# Patient Record
Sex: Female | Born: 1995 | Race: White | Hispanic: No | Marital: Single | Smoking: Never smoker
Health system: Southern US, Community
[De-identification: ages and names within clinical notes are randomized; demographics above are authoritative.]

## PROBLEM LIST (undated history)

## (undated) DIAGNOSIS — J189 Pneumonia, unspecified organism: Secondary | ICD-10-CM

---
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
## 2015-01-22 ENCOUNTER — Encounter (HOSPITAL_COMMUNITY): Payer: Self-pay | Admitting: Emergency Medicine

## 2015-01-22 ENCOUNTER — Emergency Department (HOSPITAL_COMMUNITY): Payer: Managed Care, Other (non HMO)

## 2015-01-22 ENCOUNTER — Emergency Department (HOSPITAL_COMMUNITY)
Admission: EM | Admit: 2015-01-22 | Discharge: 2015-01-22 | Disposition: A | Discharge status: Home / Self Care | Payer: Managed Care, Other (non HMO) | Attending: Emergency Medicine | Admitting: Emergency Medicine

## 2015-01-22 DIAGNOSIS — R55 Syncope and collapse: Secondary | ICD-10-CM

## 2015-01-22 DIAGNOSIS — J189 Pneumonia, unspecified organism: Secondary | ICD-10-CM | POA: Insufficient documentation

## 2015-01-22 DIAGNOSIS — Z792 Long term (current) use of antibiotics: Secondary | ICD-10-CM | POA: Diagnosis not present

## 2015-01-22 DIAGNOSIS — R0602 Shortness of breath: Secondary | ICD-10-CM | POA: Diagnosis not present

## 2015-01-22 DIAGNOSIS — Z3202 Encounter for pregnancy test, result negative: Secondary | ICD-10-CM | POA: Diagnosis not present

## 2015-01-22 HISTORY — DX: Pneumonia, unspecified organism: J18.9

## 2015-01-22 LAB — BASIC METABOLIC PANEL
Anion gap: 8 (ref 5–15)
BUN: 12 mg/dL (ref 6–20)
CHLORIDE: 101 mmol/L (ref 101–111)
CO2: 27 mmol/L (ref 22–32)
CREATININE: 0.62 mg/dL (ref 0.44–1.00)
Calcium: 9.3 mg/dL (ref 8.9–10.3)
GFR calc Af Amer: >60 mL/min (ref >60)
GFR calc non Af Amer: >60 mL/min (ref >60)
GLUCOSE: 88 mg/dL (ref 65–99)
POTASSIUM: 3.9 mmol/L (ref 3.5–5.1)
SODIUM: 136 mmol/L (ref 135–145)

## 2015-01-22 LAB — D-DIMER, QUANTITATIVE: D-Dimer, Quant: 1.06 ug/mL-FEU — ABNORMAL HIGH (ref 0.00–0.48)

## 2015-01-22 LAB — CBC
HEMATOCRIT: 38.8 % (ref 36.0–46.0)
HEMOGLOBIN: 13.1 g/dL (ref 12.0–15.0)
MCH: 29.5 pg (ref 26.0–34.0)
MCHC: 33.8 g/dL (ref 30.0–36.0)
MCV: 87.4 fL (ref 78.0–100.0)
Platelets: 485 10*3/uL — ABNORMAL HIGH (ref 150–400)
RBC: 4.44 MIL/uL (ref 3.87–5.11)
RDW: 12.4 % (ref 11.5–15.5)
WBC: 8.2 10*3/uL (ref 4.0–10.5)

## 2015-01-22 LAB — PREGNANCY, URINE: PREG TEST UR: NEGATIVE

## 2015-01-22 MED ORDER — IOHEXOL 350 MG/ML SOLN
100.0000 mL | Freq: Once | INTRAVENOUS | Status: AC | PRN
  Administered 2015-01-22: 100 mL via INTRAVENOUS

## 2015-01-22 NOTE — Discharge Instructions (Signed)
Continue the anti-biotics you are currently taking for pneumonia. Continue hydrating with fluids at home, drink plenty of water and Gatorade. Return to the ER if any severe chest pain, shortness of breath, lightheadedness or dizziness, high fever greater than 100.47F, worsening of symptoms.   Syncope Syncope is a medical term for fainting or passing out. This means you lose consciousness and drop to the ground. People are generally unconscious for less than 5 minutes. You may have some muscle twitches for up to 15 seconds before waking up and returning to normal. Syncope occurs more often in older adults, but it can happen to anyone. While most causes of syncope are not dangerous, syncope can be a sign of a serious medical problem. It is important to seek medical care.  CAUSES  Syncope is caused by a sudden drop in blood flow to the brain. The specific cause is often not determined. Factors that can bring on syncope include:  Taking medicines that lower blood pressure.  Sudden changes in posture, such as standing up quickly.  Taking more medicine than prescribed.  Standing in one place for too long.  Seizure disorders.  Dehydration and excessive exposure to heat.  Low blood sugar (hypoglycemia).  Straining to have a bowel movement.  Heart disease, irregular heartbeat, or other circulatory problems.  Fear, emotional distress, seeing blood, or severe pain. SYMPTOMS  Right before fainting, you may:  Feel dizzy or light-headed.  Feel nauseous.  See all white or all black in your field of vision.  Have cold, clammy skin. DIAGNOSIS  Your health care provider will ask about your symptoms, perform a physical exam, and perform an electrocardiogram (ECG) to record the electrical activity of your heart. Your health care provider may also perform other heart or blood tests to determine the cause of your syncope which may include:  Transthoracic echocardiogram (TTE). During  echocardiography, sound waves are used to evaluate how blood flows through your heart.  Transesophageal echocardiogram (TEE).  Cardiac monitoring. This allows your health care provider to monitor your heart rate and rhythm in real time.  Holter monitor. This is a portable device that records your heartbeat and can help diagnose heart arrhythmias. It allows your health care provider to track your heart activity for several days, if needed.  Stress tests by exercise or by giving medicine that makes the heart beat faster. TREATMENT  In most cases, no treatment is needed. Depending on the cause of your syncope, your health care provider may recommend changing or stopping some of your medicines. HOME CARE INSTRUCTIONS  Have someone stay with you until you feel stable.  Do not drive, use machinery, or play sports until your health care provider says it is okay.  Keep all follow-up appointments as directed by your health care provider.  Lie down right away if you start feeling like you might faint. Breathe deeply and steadily. Wait until all the symptoms have passed.  Drink enough fluids to keep your urine clear or pale yellow.  If you are taking blood pressure or heart medicine, get up slowly and take several minutes to sit and then stand. This can reduce dizziness. SEEK IMMEDIATE MEDICAL CARE IF:   You have a severe headache.  You have unusual pain in the chest, abdomen, or back.  You are bleeding from your mouth or rectum, or you have black or tarry stool.  You have an irregular or very fast heartbeat.  You have pain with breathing.  You have repeated fainting or  seizure-like jerking during an episode.  You faint when sitting or lying down.  You have confusion.  You have trouble walking.  You have severe weakness.  You have vision problems. If you fainted, call your local emergency services (911 in [place] Do not drive yourself to the hospital.  MAKE SURE YOU:  Understand  these instructions.  Will watch your condition.  Will get help right away if you are not doing well or get worse. Document Released: 07/22/2005 Document Revised: 07/27/2013 Document Reviewed: 09/20/2011 [facility] Patient Information 2015 [place], [place]. This information is not intended to replace advice given to you by your health care provider. Make sure you discuss any questions you have with your health care provider.

## 2015-01-22 NOTE — ED Provider Notes (Signed)
CSN: [id number]     Arrival date & time 01/22/15  1442 History   First MD Initiated Contact with Patient 01/22/15 1538     Chief Complaint  Patient presents with  . Loss of Consciousness     (Consider location/radiation/quality/duration/timing/severity/associated sxs/prior Treatment) HPI Jody Cook is a 19 year old female with no past medical history who presents the ER complaining of syncopal episode. Patient states that over the last week she was hospitalized for pneumonia popping out of town in [place]. Patient states she was just discharged this past Wednesday, 5 days ago. She states she is currently on Keflex by mouth for her pneumonia. Patient reports improvement of her symptoms of pneumonia. Patient states over the last week she's had poor PO intake , and has had no appetite. She states she has been drinking fluids, however has not eaten much. Patient states she has not eaten anything since last night. Patient states today she was waiting on line at [employer], and began feeling "lightheaded". Patient states she tried to hold on, however suffered a syncopal episode. Patient's family in the room with her state they missed this episode, and were able to catch her and sit her down. The report a complete loss of consciousness with rapid regaining of consciousness afterwards. They report mild fasciculations, however patient states she is able to recall all the events before and after this episode. There is no urinary or bowel incontinence. Patient denies any associated chest pain, shortness of breath, nausea, vomiting, abdominal pain, dysuria.  Past Medical History  Diagnosis Date  . Pneumonia    History reviewed. No pertinent past surgical history. History reviewed. No pertinent family history. History  Substance Use Topics  . Smoking status: Never Smoker   . Smokeless tobacco: Not on file  . Alcohol Use: No   OB History    No data available     Review of Systems  Constitutional:  Negative for fever.  HENT: Negative for trouble swallowing.   Eyes: Negative for visual disturbance.  Respiratory: Negative for shortness of breath.   Cardiovascular: Negative for chest pain.  Gastrointestinal: Negative for nausea, vomiting and abdominal pain.  Genitourinary: Negative for dysuria.  Musculoskeletal: Negative for neck pain.  Skin: Negative for rash.  Neurological: Positive for syncope. Negative for dizziness, weakness and numbness.  Psychiatric/Behavioral: Negative.     Allergies  Review of patient's allergies indicates no known allergies.  Home Medications   Prior to Admission medications   Medication Sig Start Date End Date Taking? Authorizing Provider  acetaminophen (TYLENOL) 500 MG tablet Take 1,000 mg by mouth every 6 (six) hours as needed for mild pain or moderate pain.   Yes Historical Provider, MD  CEPHALEXIN PO Take 1 capsule by mouth 4 (four) times daily.   Yes Historical Provider, MD   BP 107/60 mmHg  Pulse 78  Temp(Src) 97.4 F (36.3 C) (Oral)  Resp 20  SpO2 100%  LMP 01/19/2015 Physical Exam  Constitutional: She is oriented to person, place, and time. She appears well-developed and well-nourished. No distress.  HENT:  Head: Normocephalic and atraumatic.  Mouth/Throat: Oropharynx is clear and moist. No oropharyngeal exudate.  Eyes: Right eye exhibits no discharge. Left eye exhibits no discharge. No scleral icterus.  Neck: Normal range of motion.  Cardiovascular: Normal rate, regular rhythm and normal heart sounds.   No murmur heard. Pulmonary/Chest: Effort normal and breath sounds normal. No respiratory distress.  Abdominal: Soft. There is no tenderness.  Musculoskeletal: Normal range of motion. She exhibits  no edema or tenderness.  Neurological: She is alert and oriented to person, place, and time. She has normal strength. No cranial nerve deficit or sensory deficit. Coordination normal. GCS eye subscore is 4. GCS verbal subscore is 5. GCS motor  subscore is 6.  Patient fully alert, answering questions appropriately in full, clear sentences. Cranial nerves II through XII grossly intact. Motor strength 5 out of 5 in all major muscle groups of upper and lower extremities. Distal sensation intact.   Skin: Skin is warm and dry. No rash noted. She is not diaphoretic.  Psychiatric: She has a normal mood and affect.  Nursing note and vitals reviewed.   ED Course  Procedures (including critical care time) Labs Review Labs Reviewed  CBC - Abnormal; Notable for the following:    Platelets 485 (*)    All other components within normal limits  D-DIMER, QUANTITATIVE (NOT AT [facility]) - Abnormal; Notable for the following:    D-Dimer, Quant 1.06 (*)    All other components within normal limits  BASIC METABOLIC PANEL  PREGNANCY, URINE    Imaging Review Dg Chest 2 View  01/22/2015   CLINICAL DATA:  Cough and congestion; shortness of breath  EXAM: CHEST  2 VIEW  COMPARISON:  None.  FINDINGS: There is patchy infiltrate in the right middle lobe. Lungs elsewhere clear. Heart size and pulmonary vascularity are normal. No adenopathy. No bone lesions. There is slight mid thoracic dextroscoliosis.  IMPRESSION: Patchy infiltrate right middle lobe.   Electronically Signed   By: Bretta Bang III M.D.   On: 01/22/2015 16:25   Ct Angio Chest Pe W/cm &/or Wo Cm  01/22/2015   CLINICAL DATA:  19 year old female with a history of pneumonia. Syncope.  EXAM: CT ANGIOGRAPHY CHEST WITH CONTRAST  TECHNIQUE: Multidetector CT imaging of the chest was performed using the standard protocol during bolus administration of intravenous contrast. Multiplanar CT image reconstructions and MIPs were obtained to evaluate the vascular anatomy.  CONTRAST:  OMNIPAQUE IOHEXOL 350 MG/ML SOLN  COMPARISON:  Chest x-ray 01/22/2015  FINDINGS: Chest:  Unremarkable superficial soft tissues of the chest wall.  No axillary or supraclavicular adenopathy.  Unremarkable thoracic inlet.   Central airways are clear.  Unremarkable appearance of the thoracic esophagus.  Heart size within normal limits.  No pericardial fluid/ thickening.  Course caliber and contour of the thoracic aorta unremarkable. No aneurysm dissection flap or periaortic fluid.  Unremarkable caliber of the pulmonary artery. No central, lobar, or segmental filling defects.  Minimal volume loss of the right middle lobe with bronchial wall thickening and nodular opacities in a peribronchovascular distribution.  Ground-glass/nodular opacity in the medial segment of the right lower lobe. Unremarkable left lung.  Upper abdomen:  Unremarkable appearance of the visualized upper abdomen.  No displaced fracture.  Review of the MIP images confirms the above findings.  IMPRESSION: Negative for pulmonary emboli.  Nodular/ground-glass opacities of the medial segment of the right lower lobe as well as the medial and lateral segment of the right middle lobe, compatible with infectious/inflammatory changes in this patient with a history of treated pneumonia. Follow chest x-ray once the patient has completed a course of antibiotics may be useful.  Signed,  Yvone Neu. Loreta Ave, DO  [facility]  [facility]   Electronically Signed   By: Gilmer Mor D.O.   On: 01/22/2015 18:17     EKG Interpretation   Date/Time:  Sunday January 22 2015 14:48:00 EDT Ventricular Rate:  39  PR Interval:  161 QRS Duration: 78 QT Interval:  400 QTC Calculation: 435 R Axis:   35 Text Interpretation:  Sinus rhythm RSR' in V1 or V2, right VCD or RVH ST  elev, probable normal early repol pattern Baseline wander in lead(s) V4 V5  V6 Confirmed by Freida Busman  MD, ANTHONY ([id number]) on 01/22/2015 3:34:06 PM      MDM   Final diagnoses:  PNA (pneumonia)  SOB (shortness of breath)  Syncope, unspecified syncope type    Patient here status post syncopal episode this afternoon in the setting of recent hospitalization for pneumonia,  in [place] and 11-hour drive yesterday to come back into town. Patient reports poor by mouth intake the past week, due to decreased appetite. In the ED, patient is completely asymptomatic. Given patient's recent travel, recent hospitalization and Loestrin use, d-dimer sent.   Chest x-ray shows patchy right middle lobe infiltrate, likely resolving pneumonia given patient's improvement of symptoms since diagnosis and compliant with antibiotics.  D-dimer is noted to be elevated, will follow up with CT angiogram of chest  CT angiogram with impression: Negative for pulmonary emboli.  Nodular/ground-glass opacities of the medial segment of the right lower lobe as well as the medial and lateral segment of the right middle lobe, compatible with infectious/inflammatory changes in this patient with a history of treated pneumonia. Follow chest x-ray once the patient has completed a course of antibiotics may be useful.  Patient is noted to have elevation of heart rate with orthostatic vital signs, likely dehydration as a cause of patient's syncope. His labs are unremarkable for acute pathology otherwise. EKG is without evidence of acute injury or ectopy. With patient's age and symptom profile, no concern for cardiac etiology of patient's syncope. Patient afebrile, hemodynamically stable, well appearing, asymptomatic and in no acute distress throughout his stay here. Patient stable for discharge. Patient strongly encouraged follow-up with primary care physician, encouraged to continue anabiotic regimen for pneumonia. Return precautions discussed, patient verbalizes understanding and agreement of this plan.  BP 107/60 mmHg  Pulse 78  Temp(Src) 97.4 F (36.3 C) (Oral)  Resp 20  SpO2 100%  LMP 01/19/2015  Signed,  Ladona Mow, PA-C 12:03 AM  Patient discussed with Dr. Lorre Nick, MD   Ladona Mow, PA-C 01/23/15 0003  Lorre Nick, MD 01/23/15 2038

## 2015-01-22 NOTE — ED Notes (Signed)
Per EMS: Pt was dx w/ pna last week.  Still on zpack.  Was standing in line at [employer], staring at chicken tenders, and passed out.  Family states that they eased her to the ground.  No head injury.  Pt is A&O x 4 and was able to ambulate to triage chair.  No pain.

## 2016-02-16 IMAGING — CR DG CHEST 2V
2 series · 2 of 2 positions shown · non-contrast
Comparison: None.

CLINICAL DATA: Cough and congestion; shortness of breath

EXAM:
CHEST  2 VIEW

[w chest pa]
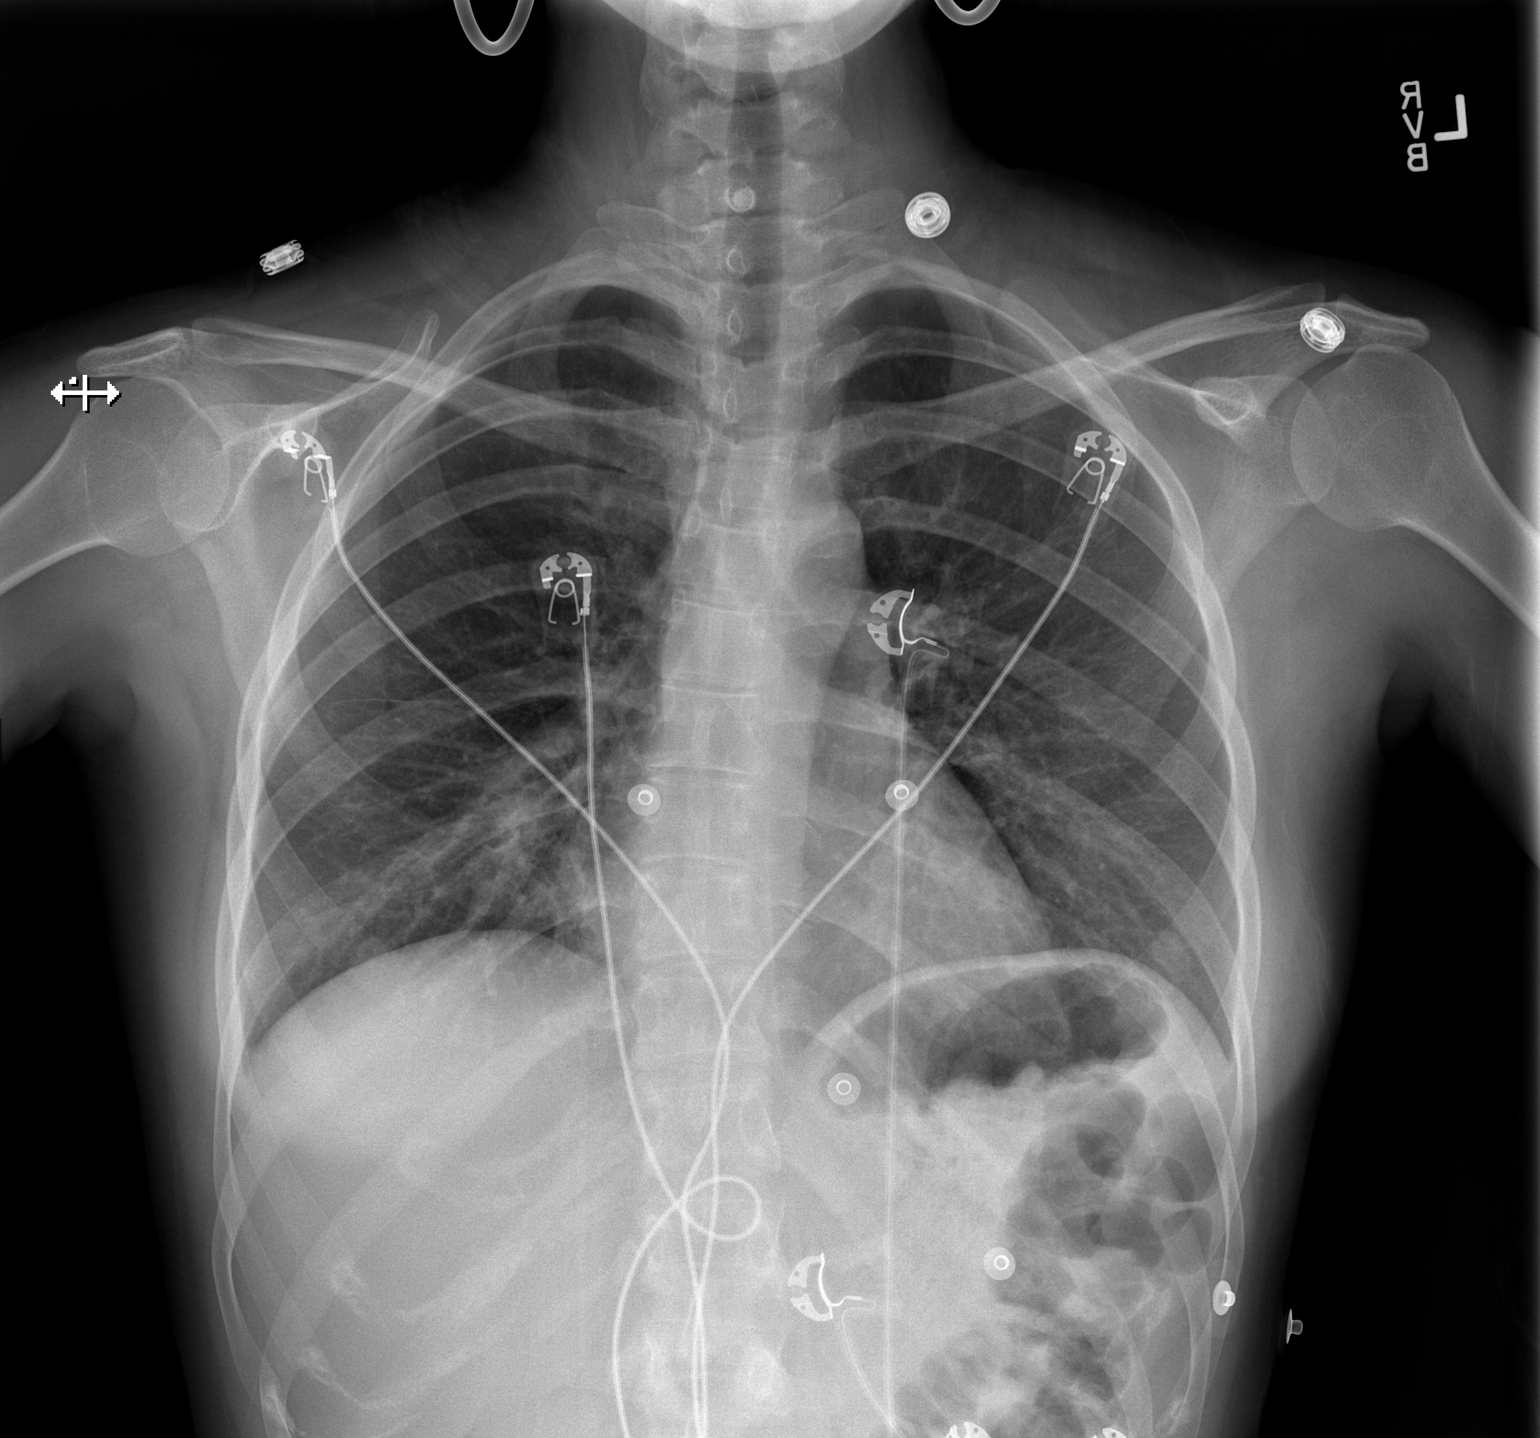

[w chest lat]
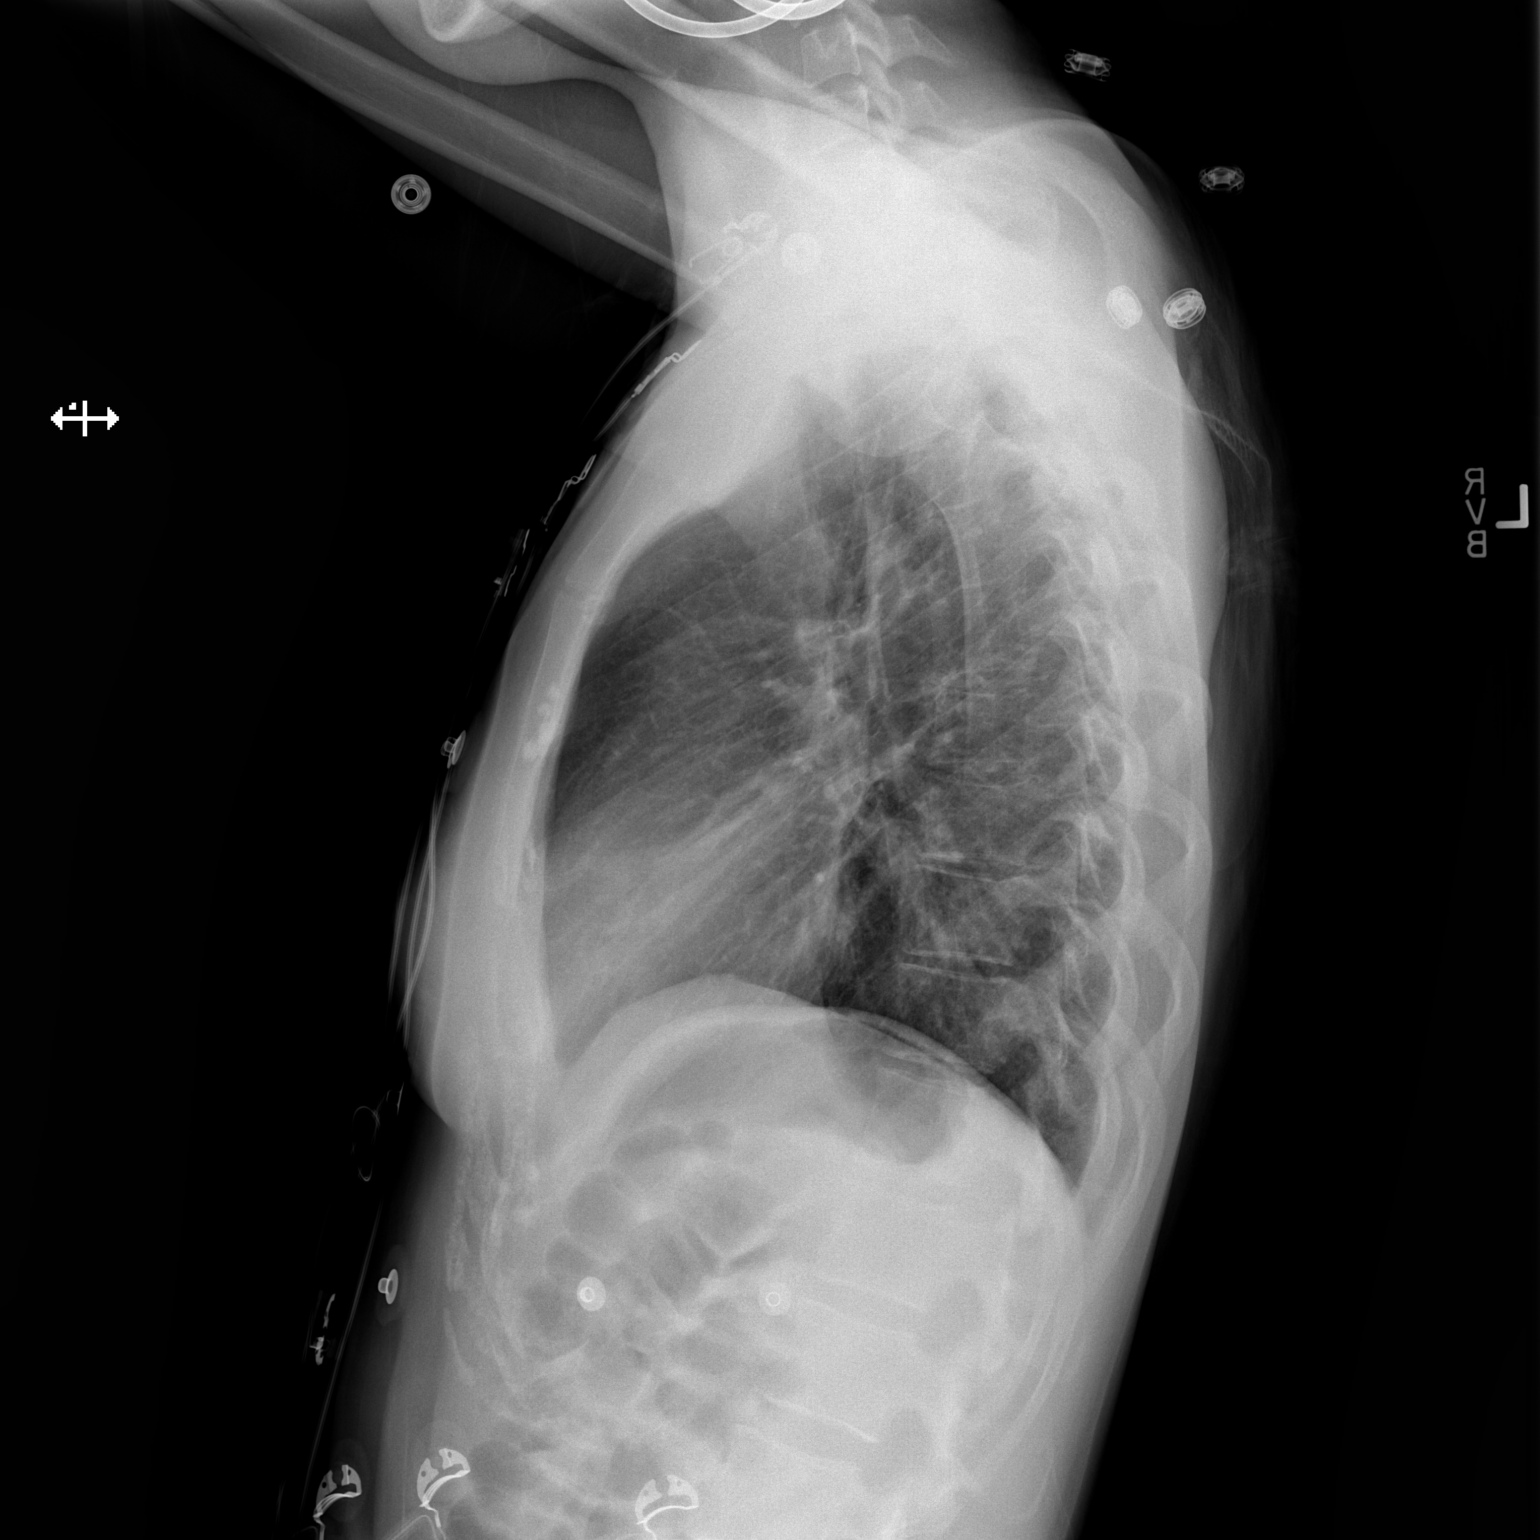

[2 of 2 positions shown; findings below may reference images not displayed]

FINDINGS: There is patchy infiltrate in the right middle lobe. Lungs elsewhere
clear. Heart size and pulmonary vascularity are normal. No
adenopathy. No bone lesions. There is slight mid thoracic
dextroscoliosis.
IMPRESSION: Patchy infiltrate right middle lobe.
